# Patient Record
Sex: Male | Born: 1980 | Race: White | Hispanic: No | Marital: Single | Smoking: Current every day smoker
Health system: Southern US, Community
[De-identification: ages and names within clinical notes are randomized; demographics above are authoritative.]

## PROBLEM LIST (undated history)

## (undated) HISTORY — PX: APPENDECTOMY: SHX54

## (undated) HISTORY — PX: TOOTH EXTRACTION: SUR596

---

## 2004-03-21 ENCOUNTER — Emergency Department: Payer: Self-pay | Admitting: Emergency Medicine

## 2004-06-29 ENCOUNTER — Ambulatory Visit: Payer: Self-pay | Admitting: Internal Medicine

## 2008-02-22 ENCOUNTER — Emergency Department: Payer: Self-pay | Admitting: Emergency Medicine

## 2019-01-17 ENCOUNTER — Emergency Department
Admission: EM | Admit: 2019-01-17 | Discharge: 2019-01-18 | Disposition: A | Discharge status: Home / Self Care | Payer: Self-pay | Attending: Emergency Medicine | Admitting: Emergency Medicine

## 2019-01-17 ENCOUNTER — Encounter: Payer: Self-pay | Admitting: Emergency Medicine

## 2019-01-17 ENCOUNTER — Emergency Department: Payer: Self-pay

## 2019-01-17 ENCOUNTER — Other Ambulatory Visit: Payer: Self-pay

## 2019-01-17 DIAGNOSIS — R55 Syncope and collapse: Secondary | ICD-10-CM | POA: Insufficient documentation

## 2019-01-17 DIAGNOSIS — R0789 Other chest pain: Secondary | ICD-10-CM | POA: Insufficient documentation

## 2019-01-17 DIAGNOSIS — E860 Dehydration: Secondary | ICD-10-CM | POA: Insufficient documentation

## 2019-01-17 LAB — BASIC METABOLIC PANEL
Anion gap: 13 (ref 5–15)
BUN: <5 mg/dL — ABNORMAL LOW (ref 6–20)
CO2: 20 mmol/L — ABNORMAL LOW (ref 22–32)
Calcium: 9.4 mg/dL (ref 8.9–10.3)
Chloride: 101 mmol/L (ref 98–111)
Creatinine, Ser: 0.8 mg/dL (ref 0.61–1.24)
GFR calc Af Amer: >60 mL/min (ref >60)
GFR calc non Af Amer: >60 mL/min (ref >60)
Glucose, Bld: 105 mg/dL — ABNORMAL HIGH (ref 70–99)
Potassium: 3.6 mmol/L (ref 3.5–5.1)
Sodium: 134 mmol/L — ABNORMAL LOW (ref 135–145)

## 2019-01-17 LAB — TROPONIN I (HIGH SENSITIVITY): Troponin I (High Sensitivity): 3 ng/L (ref <18)

## 2019-01-17 LAB — CBC
HCT: 46.1 % (ref 39.0–52.0)
Hemoglobin: 16.2 g/dL (ref 13.0–17.0)
MCH: 33.1 pg (ref 26.0–34.0)
MCHC: 35.1 g/dL (ref 30.0–36.0)
MCV: 94.3 fL (ref 80.0–100.0)
Platelets: 243 10*3/uL (ref 150–400)
RBC: 4.89 MIL/uL (ref 4.22–5.81)
RDW: 12.8 % (ref 11.5–15.5)
WBC: 10.2 10*3/uL (ref 4.0–10.5)
nRBC: 0 % (ref 0.0–0.2)

## 2019-01-17 MED ORDER — SODIUM CHLORIDE 0.9 % IV BOLUS
1000.0000 mL | Freq: Once | INTRAVENOUS | Status: AC
  Administered 2019-01-18: 1000 mL via INTRAVENOUS

## 2019-01-17 MED ORDER — SODIUM CHLORIDE 0.9% FLUSH: 3.0000 mL | Freq: Once | INTRAVENOUS | Status: DC

## 2019-01-17 NOTE — ED Triage Notes (Signed)
C/O dizziness x 1 hour.  States arms and legs are shaking, bilateral arm numbness.  Patient is anxious.  No SOB/ DOE.  NAD

## 2019-01-17 NOTE — ED Notes (Signed)
Dr Alfred Levins in to see pt

## 2019-01-17 NOTE — ED Notes (Signed)
See triage note. Pt c/o tremor to BUE with numbness to both arms that has improved since arrival. ROM intact, grip strength equal. No facial droop, no slurred speech noted at this time. Pt ambulatory with steady gait. States this has never happened to him before. Pt does endorse alcohol use about x3 days/wk, drinking 6-12 beers at a time. Denies any illicit drug use.

## 2019-01-17 NOTE — ED Provider Notes (Addendum)
[facility] Emergency Department Provider Note  ____________________________________________  Time seen: Approximately 11:40 PM  I have reviewed the triage vital signs and the nursing notes.   HISTORY  Chief Complaint No chief complaint on file.   HPI Robert Henderson is a 38 y.o. male with a history of smoking and alcohol abuse who presents for evaluation of chest pain.  Patient reports being in his usual state of health this afternoon when he drove over to his boss to feed his animals.  On his way back home he started feeling lightheaded like he was going to pass out.  He felt nauseated and started having left-sided chest pain.  This started around 2:30 PM.  The pain has been constant.  He describes the pain as stabbing, located in the left side of his chest, constant and nonradiating.  He reports associated numbness of bilateral upper extremities and also mild tremor.  Patient reports having 12 beers yesterday evening.  He also works a lot around [employer].  He works with a Runner, broadcasting/film/video.  He denies any known trauma.  He denies any shortness of breath, fever, cough, personal or family history of heart problems, PE or DVT, recent travel immobilization, leg pain or swelling, hemoptysis, or exogenous hormones.  He denies any drug use.   PMH None - reviewed  Past Surgical History:  Procedure Laterality Date  . APPENDECTOMY      Prior to Admission medications   Not on File    Allergies Augmentin [amoxicillin-pot clavulanate]  FH No history of PE, DVT, CAD  Social History Social History   Tobacco Use  . Smoking status: Current Every Day Smoker    Packs/day: 1.00    Types: Cigarettes  . Smokeless tobacco: Never Used  Substance Use Topics  . Alcohol use: Yes    Comment: 6-12 beers x3 days a week  . Drug use: Not on file    Review of Systems  Constitutional: Negative for fever. + Lightheadedness Eyes: Negative for visual changes. ENT:  Negative for sore throat. Neck: No neck pain  Cardiovascular: + chest pain. Respiratory: Negative for shortness of breath. Gastrointestinal: Negative for abdominal pain, vomiting or diarrhea. + Nausea Genitourinary: Negative for dysuria. Musculoskeletal: Negative for back pain. Skin: Negative for rash. Neurological: Negative for headaches, weakness. + b/l upper extremity numbness and tremor Psych: No SI or HI  ____________________________________________   PHYSICAL EXAM:  VITAL SIGNS: ED Triage Vitals  Enc Vitals Group     BP 01/17/19 1644 (!) 151/89     Pulse Rate 01/17/19 1644 91     Resp 01/17/19 1644 20     Temp 01/17/19 1644 98.9 F (37.2 C)     Temp Source 01/17/19 1644 Oral     SpO2 01/17/19 1644 100 %     Weight 01/17/19 1634 280 lb (127 kg)     Height 01/17/19 1645 6' (1.829 m)     Head Circumference --      Peak Flow --      Pain Score 01/17/19 1634 0     Pain Loc --      Pain Edu? --      Excl. in [place]? --     Constitutional: Alert and oriented. Well appearing and in no apparent distress. HEENT:      Head: Normocephalic and atraumatic.         Eyes: Conjunctivae are normal. Sclera is non-icteric.       Mouth/Throat: Mucous membranes are  moist.       Neck: Supple with no signs of meningismus. Cardiovascular: Regular rate and rhythm. No murmurs, gallops, or rubs. 2+ symmetrical distal pulses are present in all extremities. No JVD.  Palpation of the left side of the chest reproduces the pain. Respiratory: Normal respiratory effort. Lungs are clear to auscultation bilaterally. No wheezes, crackles, or rhonchi.  Gastrointestinal: Soft, non tender, and non distended with positive bowel sounds. No rebound or guarding. Musculoskeletal: Nontender with normal range of motion in all extremities. No edema, cyanosis, or erythema of extremities. Neurologic: Normal speech and language. Face is symmetric.  Intact strength and sensation x4, patient does have a slight tremor.  Skin: Skin is warm, dry and intact. No rash noted. Psychiatric: Mood and affect are normal. Speech and behavior are normal.  ____________________________________________   LABS (all labs ordered are listed, but only abnormal results are displayed)  Labs Reviewed  BASIC METABOLIC PANEL - Abnormal; Notable for the following components:      Result Value   Sodium 134 (*)    CO2 20 (*)    Glucose, Bld 105 (*)    BUN <5 (*)    All other components within normal limits  CBC  URINE DRUG SCREEN, QUALITATIVE ([facility] ONLY)  CK  ETHANOL  TROPONIN I (HIGH SENSITIVITY)  TROPONIN I (HIGH SENSITIVITY)   ____________________________________________  EKG  ED ECG REPORT I, Rudene Re, the attending physician, personally viewed and interpreted this ECG.  Sinus bradycardia, rate of 57, normal intervals, normal axis, no ST elevations or depressions.  Normal EKG otherwise. ____________________________________________  RADIOLOGY  I have personally reviewed the images performed during this visit and I agree with the Radiologist's read.   Interpretation by Radiologist:  Dg Chest 2 View  Result Date: 01/17/2019 CLINICAL DATA:  Chest tightness.  Numbness in both arms. EXAM: CHEST - 2 VIEW COMPARISON:  None. FINDINGS: The heart size and mediastinal contours are within normal limits. Both lungs are clear. The visualized skeletal structures are unremarkable. IMPRESSION: No active cardiopulmonary disease. Electronically Signed   By: Dorise Bullion III M.D   On: 01/17/2019 17:02   Ct Angio Chest Aorta W And/or Wo Contrast  Result Date: 01/18/2019 CLINICAL DATA:  Chest and back pain, aortic dissection suspected EXAM: CT ANGIOGRAPHY CHEST WITH CONTRAST TECHNIQUE: Multidetector CT imaging of the chest was performed using the standard protocol during bolus administration of intravenous contrast. Multiplanar CT image reconstructions and MIPs were obtained to evaluate the vascular anatomy. CONTRAST:   142mL OMNIPAQUE IOHEXOL 350 MG/ML SOLN COMPARISON:  Chest radiograph January 17, 2019 FINDINGS: Cardiovascular: Noncontrast evaluation of the aorta demonstrates no abnormal hyperattenuating mural thickening or other evidence of intramural hematoma. Postcontrast acquisition demonstrates preferential opacification of the thoracic aorta. The aortic root is suboptimally assessed given cardiac pulsation artifact. The aorta is normal caliber. No intramural hematoma, dissection flap or other luminal abnormality of the aorta is seen. No periaortic stranding or hemorrhage. Normal heart size. No pericardial effusion. Trace amount of pericardial fluid is within physiologic normal. Limited evaluation of the central pulmonary arteries demonstrates no large filling defect. 9 in Mediastinum/Nodes: No enlarged mediastinal, hilar, or axillary lymph nodes. Thyroid gland, trachea, and esophagus demonstrate no significant findings. Lungs/Pleura: Bandlike scarring and/or atelectasis in the left lung base. No consolidation, features of edema, pneumothorax, or effusion. No suspicious pulmonary nodules or masses. Upper Abdomen: No acute abnormalities present in the visualized portions of the upper abdomen. Musculoskeletal: No acute osseous abnormality or suspicious osseous lesion. Review of  the MIP images confirms the above findings. IMPRESSION: No evidence of acute aortic syndrome. No acute intrathoracic process. Electronically Signed   By: Lovena Le M.D.   On: 01/18/2019 00:52     ____________________________________________   PROCEDURES  Procedure(s) performed: None Procedures Critical Care performed:  None ____________________________________________   INITIAL IMPRESSION / ASSESSMENT AND PLAN / ED COURSE   38 y.o. male with a history of smoking and alcohol abuse who presents for evaluation of lightheadedness, nausea, chest pain, b/l UE numbness and tremor. + large amount of EtOH last night.  Ddx anxiety, alcohol  withdrawal, dehydration, electrolyte abnormalities, gastritis, acs, dissection  Patient is well-appearing with a mild tremor, normal vital signs, palpation of the left chest wall reproduces the pain, lungs are clear to auscultation, pulses are equal in all 4 extremities.  He is neurologically intact otherwise.  Initial EKG was abnormal however that was due to erroneous lead placement.  Therefore repeat EKG was done which showed sinus bradycardia with no dysrhythmia or ischemic changes.  Chest x-ray with no evidence of pneumothorax, pneumonia, pulmonary edema.  Troponin x 2 negative.  Labs showing no electrolyte abnormalities, no significant dehydration.  Since patient is complaining of numbness in both upper extremities and chest pain he was sent for CT Angio of the chest to rule out dissection and that was negative for dissection, PE, or any other acute findings. CK negative for rhabdo. UDS negative. Patient given IVF and tylenol for pain. Pain most likely MSK as it is reproducible on palpation. Near syncope most likely due to dehydration in the setting of large amount of EtOH and working under heat conditions. Tremor, dizziness, and numbness resolved with IVF. Recommended heat, and tylenol/ ibuprofen for MSK CP and f/u with PCP. Discussed my standard return precautions. Patient monitored on telemetry for several hours with no evidence of dysrhythmias.       As part of my medical decision making, I reviewed the following data within the [place] notes reviewed and incorporated, Labs reviewed , EKG interpreted , Old chart reviewed, Radiograph reviewed , Notes from prior ED visits and [place] Controlled Substance Database   Patient was evaluated in Emergency Department today for the symptoms described in the history of present illness. Patient was evaluated in the context of the global COVID-19 pandemic, which necessitated consideration that the patient might be at risk for infection  with the SARS-CoV-2 virus that causes COVID-19. Institutional protocols and algorithms that pertain to the evaluation of patients at risk for COVID-19 are in a state of rapid change based on information released by regulatory bodies including the CDC and federal and state organizations. These policies and algorithms were followed during the patient's care in the ED.   ____________________________________________   FINAL CLINICAL IMPRESSION(S) / ED DIAGNOSES   Final diagnoses:  Near syncope  Atypical chest pain  Dehydration      NEW MEDICATIONS STARTED DURING THIS VISIT:  ED Discharge Orders    None       Note:  This document was prepared using Dragon voice recognition software and may include unintentional dictation errors.    Alfred Levins, [place], MD 01/18/19 [place], [place], MD 01/18/19 [id number]    Rudene Re, MD 01/18/19 [phone]

## 2019-01-18 ENCOUNTER — Emergency Department: Payer: Self-pay

## 2019-01-18 LAB — URINE DRUG SCREEN, QUALITATIVE (ARMC ONLY)
Amphetamines, Ur Screen: NOT DETECTED
Barbiturates, Ur Screen: NOT DETECTED
Benzodiazepine, Ur Scrn: NOT DETECTED
Cannabinoid 50 Ng, Ur ~~LOC~~: NOT DETECTED
Cocaine Metabolite,Ur ~~LOC~~: NOT DETECTED
MDMA (Ecstasy)Ur Screen: NOT DETECTED
Methadone Scn, Ur: NOT DETECTED
Opiate, Ur Screen: NOT DETECTED
Phencyclidine (PCP) Ur S: NOT DETECTED
Tricyclic, Ur Screen: NOT DETECTED

## 2019-01-18 LAB — TROPONIN I (HIGH SENSITIVITY): Troponin I (High Sensitivity): 4 ng/L (ref <18)

## 2019-01-18 LAB — CK: Total CK: 102 U/L (ref 49–397)

## 2019-01-18 MED ORDER — ACETAMINOPHEN 500 MG PO TABS
1000.0000 mg | ORAL_TABLET | Freq: Once | ORAL | Status: AC
  Administered 2019-01-18: 1000 mg via ORAL
  Filled 2019-01-18: qty 2

## 2019-01-18 MED ORDER — IOHEXOL 350 MG/ML SOLN
100.0000 mL | Freq: Once | INTRAVENOUS | Status: AC | PRN
  Administered 2019-01-18: 100 mL via INTRAVENOUS

## 2019-01-18 NOTE — ED Notes (Signed)
Peripheral IV discontinued. Catheter intact. No signs of infiltration or redness. Gauze applied to IV site.  Discharge instructions reviewed with patient. Questions fielded by this RN. Patient verbalizes understanding of instructions. Patient discharged home in stable condition per veronese. No acute distress noted at time of discharge.   

## 2019-01-18 NOTE — ED Notes (Signed)
IV fluids infusing without difficulty; awaiting CT, pt aware; lights out to rest; side rail up x 1 with call bell in reach;

## 2019-01-18 NOTE — ED Notes (Signed)
Pt to CT via stretcher

## 2019-01-18 NOTE — Discharge Instructions (Addendum)

## 2020-10-23 IMAGING — CR CHEST - 2 VIEW
1 series · 2 of 2 positions shown · non-contrast
Comparison: None.

CLINICAL DATA: Chest tightness.  Numbness in both arms.

EXAM:
CHEST - 2 VIEW

[Series 1: dg chest 2 view · 0.14mm/px · 2 of 2 slices shown]
[im 1/2]
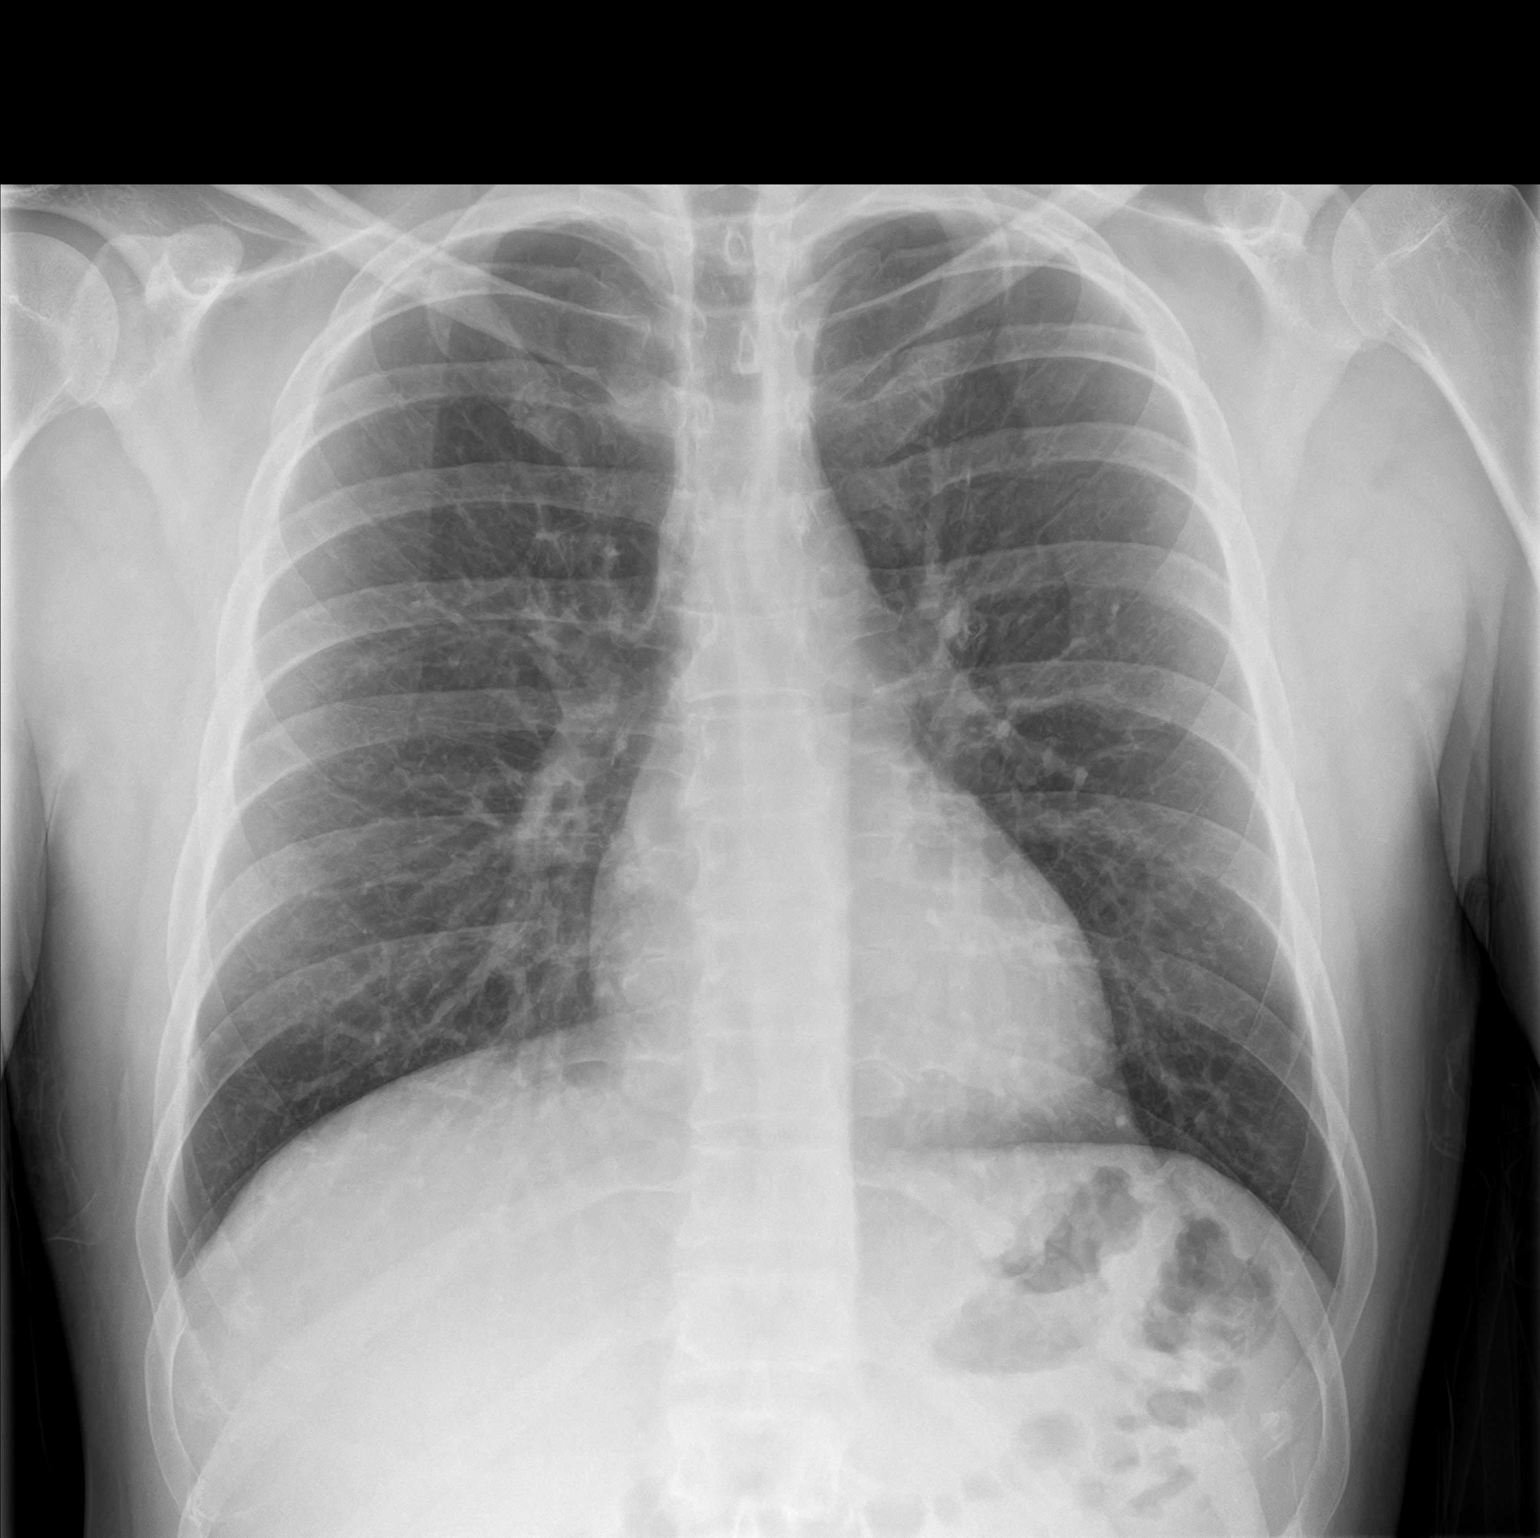
[im 2/2]
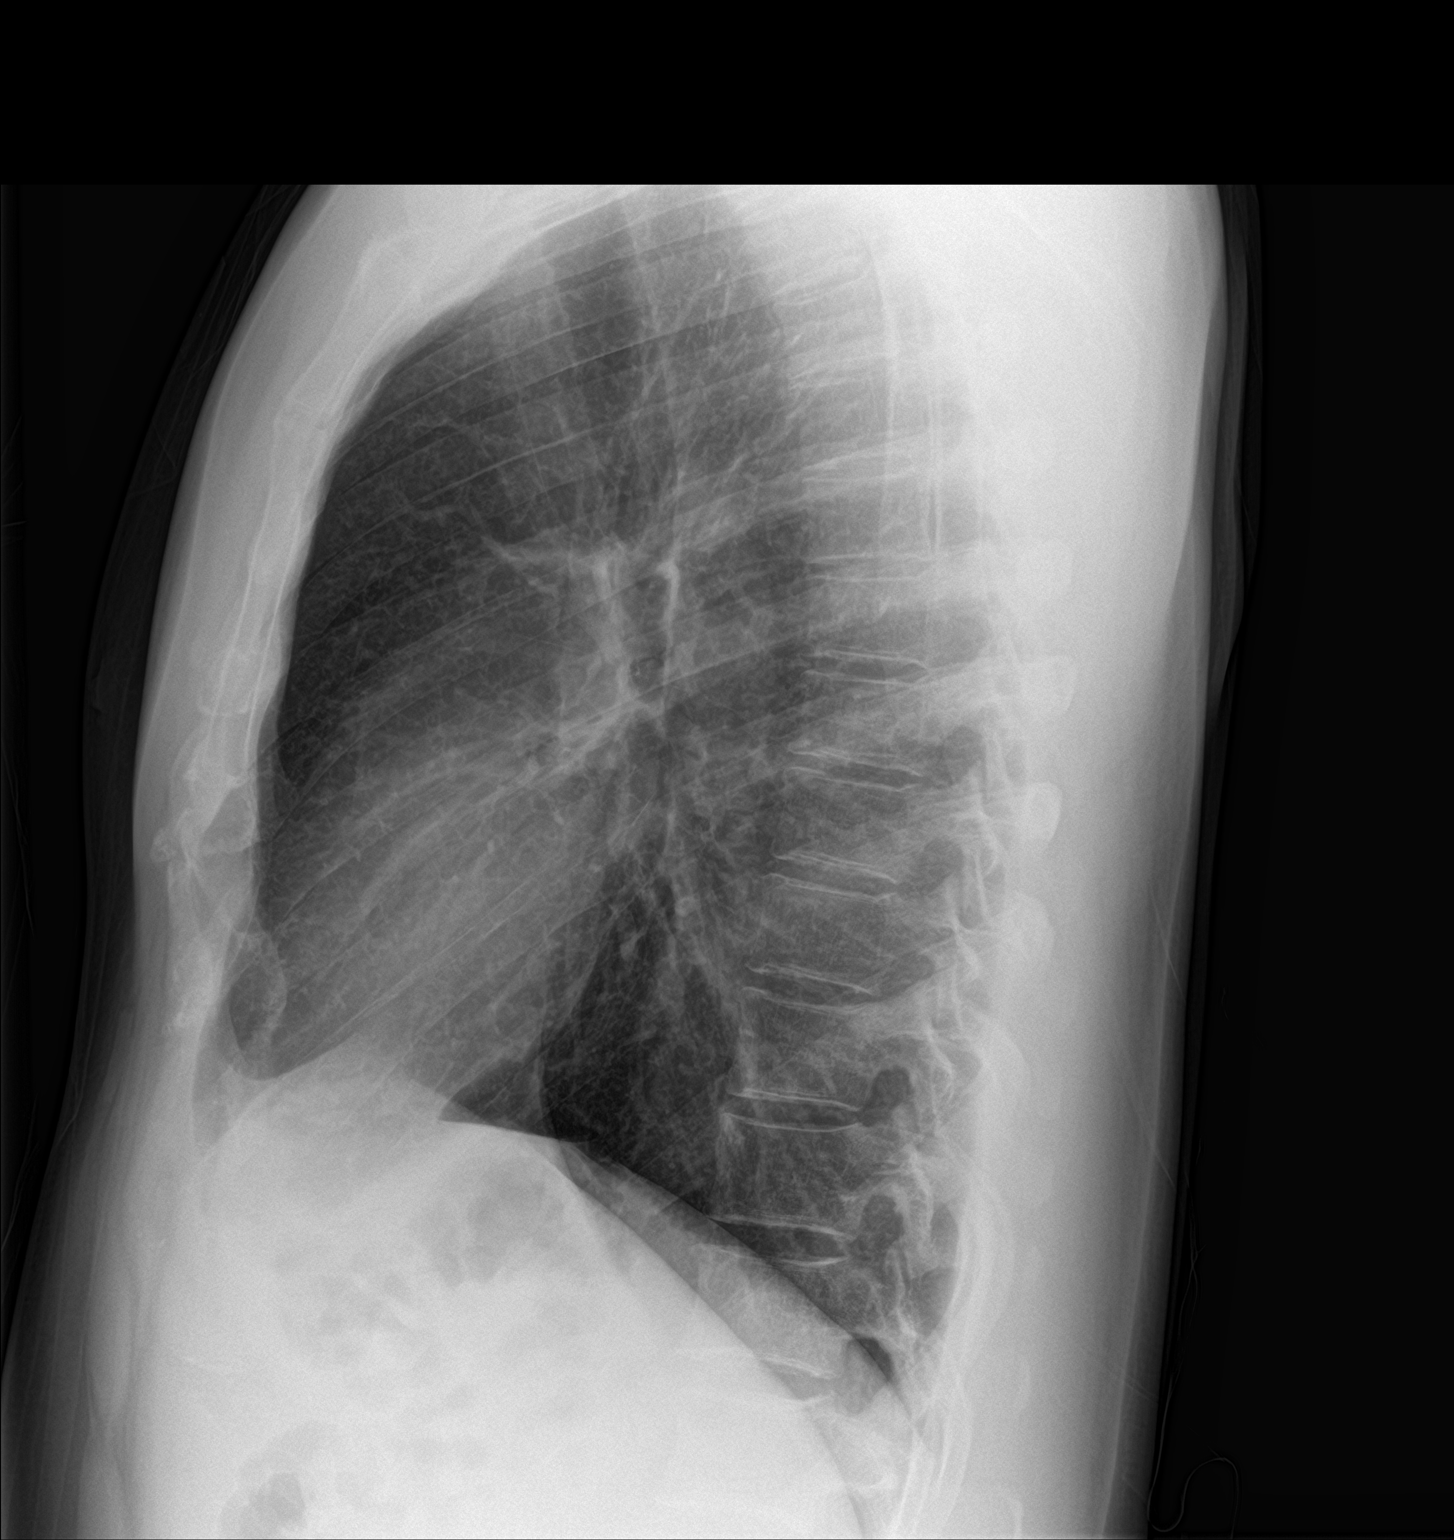

[2 of 2 positions shown; findings below may reference images not displayed]

FINDINGS: The heart size and mediastinal contours are within normal limits.
Both lungs are clear. The visualized skeletal structures are
unremarkable.
IMPRESSION: No active cardiopulmonary disease.

## 2021-07-11 ENCOUNTER — Telehealth: Payer: Self-pay

## 2021-07-11 NOTE — Telephone Encounter (Signed)
CALLED PATIENT NO ANSWER LEFT VOICEMAIL FOR A CALL BACK ? ?

## 2021-07-12 ENCOUNTER — Telehealth: Payer: Self-pay

## 2021-07-12 NOTE — Telephone Encounter (Signed)
Scheduled got 09/03/2021

## 2021-09-03 ENCOUNTER — Encounter: Payer: Self-pay | Admitting: Surgery

## 2021-09-03 ENCOUNTER — Ambulatory Visit (INDEPENDENT_AMBULATORY_CARE_PROVIDER_SITE_OTHER): Payer: Self-pay | Admitting: Gastroenterology

## 2021-09-03 ENCOUNTER — Encounter: Payer: Self-pay | Admitting: Gastroenterology

## 2021-09-03 ENCOUNTER — Ambulatory Visit (INDEPENDENT_AMBULATORY_CARE_PROVIDER_SITE_OTHER): Payer: Self-pay | Admitting: Surgery

## 2021-09-03 VITALS — BP 125/86 | HR 73 | Temp 98.3°F | Ht 72.0 in | Wt 213.0 lb

## 2021-09-03 VITALS — BP 116/79 | HR 81 | Temp 98.4°F | Ht 72.0 in | Wt 214.0 lb

## 2021-09-03 DIAGNOSIS — R1011 Right upper quadrant pain: Secondary | ICD-10-CM

## 2021-09-03 DIAGNOSIS — G8929 Other chronic pain: Secondary | ICD-10-CM

## 2021-09-03 NOTE — Progress Notes (Signed)
? ? ?Gastroenterology Consultation ? ?[facility]     [facility] [place]  [place], [name] (Inactive) ?Primary Gastroenterologist:  Robert Henderson     ?Reason for Consultation:     Gastritis ?      ? HPI:   ?Robert Henderson is a 41 y.o. y/o male referred for consultation & management of gastritis by Dr. Patient, [name] (Inactive).  This patient was sent for consultation from the [place] with recent studies showing C. difficile negative with a right upper quadrant ultrasound that showed: ? ?Impression: ?1. No evidence of cholelithiasis or acute cholecystitis.  ?2. Hepatic steatosis.  ? ?In March of this year the patient was seen at the emergency department for right lower quadrant pain and had a CT scan that showed: ? ?Impression ? ?1.No CT evidence to suggest acute appendicitis or diverticulitis as clinically questioned.  ?2.Circumferential wall thickening of the colon most prominent in the descending colon to the rectum. This can be seen with colitis and can be of infectious or inflammatory etiology.  ?3.Intermediate attenuation structure within the left inguinal canal which the spermatic cord does not extend past. This could represent high riding testicle. Correlate clinically.  ?4.Additional chronic and incidental findings as above. ? ?The patient was started on ciprofloxacin and metronidazole and told to follow-up with GI. ?The patient reports that his abdominal pain is on his right side and has become constant.  He thought that it may be related to food but he states that when he started to keep a log he did not realize any foods make it better or worse.  The patient was told to take Tylenol for the pain but has not done much of that and he denies any NSAID use.  He does have occasional dark stools but not on a regular basis and sometimes he states his stools are pale. ? ?No past medical history on file. ? ?Past Surgical History:  ?Procedure Laterality Date  ? APPENDECTOMY     ? ? ?Prior to Admission medications   ?Not on File  ? ? ?No family history on file.  ? ?Social History  ? ?Tobacco Use  ? Smoking status: Every Day  ?  Packs/day: 1.00  ?  Types: Cigarettes  ? Smokeless tobacco: Never  ?Substance Use Topics  ? Alcohol use: Yes  ?  Comment: 6-12 beers x3 days a week  ? ? ?Allergies as of 09/03/2021 - Review Complete 01/18/2019  ?Allergen Reaction Noted  ? Augmentin [amoxicillin-pot clavulanate]  01/17/2019  ? ? ?Review of Systems:    ?All systems reviewed and negative except where noted in HPI. ? ? Physical Exam:  ?There were no vitals taken for this visit. ?No LMP for male patient. ?General:   Alert,  Well-developed, well-nourished, pleasant and cooperative in NAD ?Head:  Normocephalic and atraumatic. ?Eyes:  Sclera clear, no icterus.   Conjunctiva pink. ?Ears:  Normal auditory acuity. ?Neck:  Supple; no masses or thyromegaly. ?Lungs:  Respirations even and unlabored.  Clear throughout to auscultation.   No wheezes, crackles, or rhonchi. No acute distress. ?Heart:  Regular rate and rhythm; no murmurs, clicks, rubs, or gallops. ?Abdomen:  Normal bowel sounds.  No bruits.  Soft, positive tenderness with a positive Murphy sign and non-distended without masses, hepatosplenomegaly or hernias noted.  No guarding or rebound tenderness.  Negative Carnett sign.   ?Rectal:  Deferred.  ?Pulses:  Normal pulses noted. ?Extremities:  No clubbing or edema.  No  cyanosis. ?Neurologic:  Alert and oriented x3;  grossly normal neurologically. ?Skin:  Intact without significant lesions or rashes.  No jaundice. ?Lymph Nodes:  No significant cervical adenopathy. ?Psych:  Alert and cooperative. Normal mood and affect. ? ?Imaging Studies: ?No results found. ? ?Assessment and Plan:  ? ?Robert Henderson is a 41 y.o. y/o male who comes in today with a positive Murphy sign and severe chronic and constant right upper quadrant pain.  The patient has been explained my findings and that with the abnormal CT  scan showing colitis and intermittent dark stools he should consider an EGD and colonoscopy but because he has a significantly positive Murphy sign I would like him to be seen by a surgeon today.  I have called Robert Henderson who has agreed to see him today.  The patient will go right over to Robert Henderson office to be evaluated.  The patient has been explained the plan and agrees with it.  If his gallbladder does not seem to be the problem then he will undergo an EGD and colonoscopy. ? ? ? ?Robert Lame, MD. [facility] Regal ? ? ? Note: This dictation was prepared with Dragon dictation along with smaller phrase technology. Any transcriptional errors that result from this process are unintentional.   ?

## 2021-09-03 NOTE — H&P (View-Only) (Signed)
? ? ?Gastroenterology Consultation ? ?[place]     [facility]  Patient, [name] (Inactive) ?Primary Gastroenterologist:  Dr. Allen Norris     ?Reason for Consultation:     Gastritis ?      ? HPI:   ?Robert Henderson is a 41 y.o. y/o male referred for consultation & management of gastritis by Dr. Patient, [name] (Inactive).  This patient was sent for consultation from the [place] with recent studies showing C. difficile negative with a right upper quadrant ultrasound that showed: ? ?Impression: ?1. No evidence of cholelithiasis or acute cholecystitis.  ?2. Hepatic steatosis.  ? ?In March of this year the patient was seen at the emergency department for right lower quadrant pain and had a CT scan that showed: ? ?Impression ? ?1.No CT evidence to suggest acute appendicitis or diverticulitis as clinically questioned.  ?2.Circumferential wall thickening of the colon most prominent in the descending colon to the rectum. This can be seen with colitis and can be of infectious or inflammatory etiology.  ?3.Intermediate attenuation structure within the left inguinal canal which the spermatic cord does not extend past. This could represent high riding testicle. Correlate clinically.  ?4.Additional chronic and incidental findings as above. ? ?The patient was started on ciprofloxacin and metronidazole and told to follow-up with GI. ?The patient reports that his abdominal pain is on his right side and has become constant.  He thought that it may be related to food but he states that when he started to keep a log he did not realize any foods make it better or worse.  The patient was told to take Tylenol for the pain but has not done much of that and he denies any NSAID use.  He does have occasional dark stools but not on a regular basis and sometimes he states his stools are pale. ? ?No past medical history on file. ? ?Past Surgical History:  ?Procedure Laterality Date  ??  APPENDECTOMY    ? ? ?Prior to Admission medications   ?Not on File  ? ? ?No family history on file.  ? ?Social History  ? ?Tobacco Use  ?? Smoking status: Every Day  ?  Packs/day: 1.00  ?  Types: Cigarettes  ?? Smokeless tobacco: Never  ?Substance Use Topics  ?? Alcohol use: Yes  ?  Comment: 6-12 beers x3 days a week  ? ? ?Allergies as of 09/03/2021 - Review Complete 01/18/2019  ?Allergen Reaction Noted  ?? Augmentin [amoxicillin-pot clavulanate]  01/17/2019  ? ? ?Review of Systems:    ?All systems reviewed and negative except where noted in HPI. ? ? Physical Exam:  ?There were no vitals taken for this visit. ?No LMP for male patient. ?General:   Alert,  Well-developed, well-nourished, pleasant and cooperative in NAD ?Head:  Normocephalic and atraumatic. ?Eyes:  Sclera clear, no icterus.   Conjunctiva pink. ?Ears:  Normal auditory acuity. ?Neck:  Supple; no masses or thyromegaly. ?Lungs:  Respirations even and unlabored.  Clear throughout to auscultation.   No wheezes, crackles, or rhonchi. No acute distress. ?Heart:  Regular rate and rhythm; no murmurs, clicks, rubs, or gallops. ?Abdomen:  Normal bowel sounds.  No bruits.  Soft, positive tenderness with a positive Murphy sign and non-distended without masses, hepatosplenomegaly or hernias noted.  No guarding or rebound tenderness.  Negative Carnett sign.   ?Rectal:  Deferred.  ?Pulses:  Normal pulses noted. ?Extremities:  No clubbing or edema.  No  cyanosis. ?Neurologic:  Alert and oriented x3;  grossly normal neurologically. ?Skin:  Intact without significant lesions or rashes.  No jaundice. ?Lymph Nodes:  No significant cervical adenopathy. ?Psych:  Alert and cooperative. Normal mood and affect. ? ?Imaging Studies: ?No results found. ? ?Assessment and Plan:  ? ?Robert Henderson is a 41 y.o. y/o male who comes in today with a positive Murphy sign and severe chronic and constant right upper quadrant pain.  The patient has been explained my findings and that with  the abnormal CT scan showing colitis and intermittent dark stools he should consider an EGD and colonoscopy but because he has a significantly positive Murphy sign I would like him to be seen by a surgeon today.  I have called Dr. Dahlia Byes who has agreed to see him today.  The patient will go right over to Dr. Harlow Ohms office to be evaluated.  The patient has been explained the plan and agrees with it.  If his gallbladder does not seem to be the problem then he will undergo an EGD and colonoscopy. ? ? ? ?Lucilla Lame, MD. [facility] Regal ? ? ? Note: This dictation was prepared with Dragon dictation along with smaller phrase technology. Any transcriptional errors that result from this process are unintentional.   ?

## 2021-09-03 NOTE — Patient Instructions (Addendum)
We have scheduled you for a HIDA scan to further assess your gallbladder. ? ?This is scheduled at [facility] [place] entrance on Thursday the 20th. You will need to arrive at the [place] at 12:00 pm. You will need to have nothing to eat or drink after 6:30 am. ? ?We will have you follow up here next week.  ?

## 2021-09-04 NOTE — Progress Notes (Signed)
Patient ID: Robert Henderson, male   DOB: December 10, 1980, 41 y.o.   MRN: [id number] ? ?HPI ?Robert Henderson is a 41 y.o. male seen in consultation at the request of Dr.Wohl for right upper quadrant pain positive Murphy sign.  Upon interrogation patient patient endorses abdominal pain for the past couple of years.  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  Apparently he has had prior work-up to include a CT scan as well as ultrasound of the right upper quadrant.  Report personally reviewed showing no evidence of acute cholecystitis some hepatic asteatosis he had some questionable colitis.  And questionable high riding testicle. ?He endorses right upper quadrant pain daily.  Before there was some worsening with certain meals now he experiences no correlation. ?He did have an appendectomy when he was 71 back in [place] while he was serving in the [employer].  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  He endorses smoking daily. 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  I specifically asked him about this and he denied any use. 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  It Is in the interesting to note that at that time was presented with chest pain ?HPI ? ?No past medical history on file. ? ?Past Surgical History:  ?Procedure Laterality Date  ? APPENDECTOMY    ? ? ?Family History  ?Problem Relation Age of Onset  ? Colon polyps Mother   ? Stomach cancer Father   ? ? ?Social History ?Social History  ? ?Tobacco Use  ? Smoking status: Every Day  ?  Packs/day: 1.00  ?  Types: Cigarettes  ? Smokeless tobacco: Never  ?Vaping Use  ? Vaping Use: Never used  ?Substance Use Topics  ? Alcohol use: Not Currently  ?  Comment: 6-12 beers x3 days a week  ? ? ?Allergies  ?Allergen Reactions  ? Augmentin [Amoxicillin-Pot  Clavulanate] Hives  ? ? ?Current Outpatient Medications  ?Medication Sig Dispense Refill  ? acetaminophen (TYLENOL) 325 MG tablet Take 650 mg by mouth every 6 (six) hours as needed.    ? ibuprofen (ADVIL) 200 MG tablet Take 200 mg by mouth every 6 (six) hours as needed.    ? ?No current facility-administered medications for this visit.  ? ? ? ?Review of Systems ?Full ROS  was asked and was negative except for the information on the HPI ? ?Physical Exam ?Blood pressure 125/86, pulse 73, temperature 98.3 ?F (36.8 ?C), height 6' (1.829 m), weight 213 lb (96.6 kg), SpO2 98 %. ?CONSTITUTIONAL: NAD. ?EYES: Pupils are equal, round,, Sclera are non-icteric. ?EARS, NOSE, MOUTH AND THROAT:  The oral mucosa is pink and moist. Poor dentition with tooth decay .Hearing is intact to voice. ?LYMPH NODES:  Lymph nodes in the neck are normal. ?RESPIRATORY:  Lungs are clear. There is normal respiratory effort, with equal breath sounds bilaterally, and without pathologic use of accessory muscles.Some soreness on chest wall on the right side ?CARDIOVASCULAR: Heart is regular without murmurs, gallops, or rubs. ?GI: The abdomen is  soft, tender right upper quadrant and overall diffusely  There are no palpable masses. There is no hepatosplenomegaly. There are normal bowel sounds . ?No rebound, no peritonitis and no definitive Murphy ?GU: Rectal deferred.   ?  MUSCULOSKELETAL: Normal muscle strength and tone. No cyanosis or edema.   ?SKIN: Turgor is good and there are no pathologic skin lesions or ulcers. ?NEUROLOGIC: Motor and sensation is grossly normal. Cranial nerves are grossly intact. ?PSYCH:  Oriented to person, place and time. Affect is normal. ? ?Data Reviewed ? ?I have personally reviewed the patient's imaging, laboratory findings and medical records.   ? ?Assessment/Plan ?41 year old male with chronic abdominal pain.  Certainly differential will include gallbladder disease, musculoskeletal pain such as intercostal neuralgia and  more functional disorder such as colitis or functional GI issues.  At this time there is no need for hospitalization or immediate surgical intervention I will like to interrogate the function of his biliary system with a HIDA scan including ejection fraction.  Discussed with him in detail about my thought process. ?I will circle back with Dr.Wohl as he might need further endoscopic work-up given the prior colitis and puzzling abdominal pain etiology. ?A copy of this report was sent to the referring provider ?Please note that I have spent greater than 45 minutes in this encounter including personally reviewing records, coordinating his care, counseling the patient, placing orders and performing a appropriate documentation ? ? ?Caroleen Hamman, MD FACS ?General Surgeon ?09/04/2021, 2:25 PM ? ?  ?

## 2021-09-06 ENCOUNTER — Encounter
Admission: RE | Admit: 2021-09-06 | Discharge: 2021-09-06 | Disposition: A | Discharge status: Home / Self Care | Payer: No Typology Code available for payment source | Source: Ambulatory Visit | Attending: Surgery | Admitting: Surgery

## 2021-09-06 DIAGNOSIS — R1011 Right upper quadrant pain: Secondary | ICD-10-CM | POA: Diagnosis not present

## 2021-09-06 DIAGNOSIS — G8929 Other chronic pain: Secondary | ICD-10-CM | POA: Insufficient documentation

## 2021-09-06 MED ORDER — TECHNETIUM TC 99M MEBROFENIN IV KIT
5.0000 | PACK | Freq: Once | INTRAVENOUS | Status: AC | PRN
  Administered 2021-09-06: 5.48 via INTRAVENOUS

## 2021-09-07 ENCOUNTER — Telehealth: Payer: Self-pay

## 2021-09-07 NOTE — Telephone Encounter (Signed)
Patient notified normal HIDA scan-keep scheduled appointment 09/10/21. ?

## 2021-09-10 ENCOUNTER — Other Ambulatory Visit: Payer: Self-pay

## 2021-09-10 ENCOUNTER — Ambulatory Visit (INDEPENDENT_AMBULATORY_CARE_PROVIDER_SITE_OTHER): Payer: Self-pay | Admitting: Surgery

## 2021-09-10 ENCOUNTER — Encounter: Payer: Self-pay | Admitting: Surgery

## 2021-09-10 VITALS — BP 114/77 | HR 78 | Temp 98.1°F | Ht 72.0 in | Wt 215.0 lb

## 2021-09-10 DIAGNOSIS — Z719 Counseling, unspecified: Secondary | ICD-10-CM | POA: Insufficient documentation

## 2021-09-10 DIAGNOSIS — F1010 Alcohol abuse, uncomplicated: Secondary | ICD-10-CM | POA: Insufficient documentation

## 2021-09-10 DIAGNOSIS — L247 Irritant contact dermatitis due to plants, except food: Secondary | ICD-10-CM | POA: Insufficient documentation

## 2021-09-10 DIAGNOSIS — F410 Panic disorder [episodic paroxysmal anxiety] without agoraphobia: Secondary | ICD-10-CM | POA: Insufficient documentation

## 2021-09-10 DIAGNOSIS — R1011 Right upper quadrant pain: Secondary | ICD-10-CM

## 2021-09-10 DIAGNOSIS — R1084 Generalized abdominal pain: Secondary | ICD-10-CM | POA: Insufficient documentation

## 2021-09-10 DIAGNOSIS — K047 Periapical abscess without sinus: Secondary | ICD-10-CM | POA: Insufficient documentation

## 2021-09-10 DIAGNOSIS — M79602 Pain in left arm: Secondary | ICD-10-CM | POA: Insufficient documentation

## 2021-09-10 DIAGNOSIS — F17200 Nicotine dependence, unspecified, uncomplicated: Secondary | ICD-10-CM | POA: Insufficient documentation

## 2021-09-10 DIAGNOSIS — Z720 Tobacco use: Secondary | ICD-10-CM | POA: Insufficient documentation

## 2021-09-10 DIAGNOSIS — G8929 Other chronic pain: Secondary | ICD-10-CM

## 2021-09-10 DIAGNOSIS — R0789 Other chest pain: Secondary | ICD-10-CM

## 2021-09-10 NOTE — Progress Notes (Signed)
Outpatient Surgical Follow Up ? ?09/11/2021 ? ?Robert Henderson is an 41 y.o. male.  ? ?Chief Complaint  ?Patient presents with  ? Follow-up  ?  Abdominal pain  ? ? ?HPI: Robert Henderson is following up for right-sided chest wall pain and right upper quadrant abdominal pain.  He reports that he still has it on a daily basis.  Seems to be worse when he stands up.  Also in the [place] region.  He feels that he has about 3 knife that are stuck both on his abdominal and his chest wall. ?Recently completed a HIDA scan that have personally reviewed showing no evidence of cholecystitis and preserve EF.  No evidence of stones or cholecystitis on ultrasound. ?Pain is severe and sharp in intensity. ? ?History reviewed. No pertinent past medical history. ? ?Past Surgical History:  ?Procedure Laterality Date  ? APPENDECTOMY    ? ? ?Family History  ?Problem Relation Age of Onset  ? Colon polyps Mother   ? Stomach cancer Father   ? ? ?Social History:  reports that he has been smoking cigarettes. He has been smoking an average of 1 pack per day. He has never used smokeless tobacco. He reports that he does not currently use alcohol. No history on file for drug use. ? ?Allergies:  ?Allergies  ?Allergen Reactions  ? Amoxicillin-Pot Clavulanate Hives  ? ? ?Medications reviewed. ? ? ?ROS ?Full ROS performed and is otherwise negative other than what is stated in HPI ? ? ?BP 114/77   Pulse 78   Temp 98.1 ?F (36.7 ?C) (Oral)   Ht 6' (1.829 m)   Wt 215 lb (97.5 kg)   SpO2 98%   BMI 29.16 kg/m?  ? ?Physical Exam ?CONSTITUTIONAL: NAD. ?EYES: Pupils are equal, round,, Sclera are non-icteric. ?EARS, NOSE, MOUTH AND THROAT:  The oral mucosa is pink and moist. Poor dentition with tooth decay .Hearing is intact to voice. ?LYMPH NODES:  Lymph nodes in the neck are normal. ?RESPIRATORY:  Lungs are clear. There is normal respiratory effort, with equal breath sounds bilaterally, and without pathologic use of accessory muscles.Tenderness to palpation on  right chest wall and xiphoid ?CARDIOVASCULAR: Heart is regular without murmurs, gallops, or rubs. ?GI: The abdomen is  soft, tender right upper quadrant and overall diffusely  There are no palpable masses. There is no hepatosplenomegaly. There are normal bowel sounds . ?No rebound, no peritonitis and no definitive Murphy ?GU: Rectal deferred.   ?MUSCULOSKELETAL: Normal muscle strength and tone. No cyanosis or edema.   ?SKIN: Turgor is good and there are no pathologic skin lesions or ulcers. ?NEUROLOGIC: Motor and sensation is grossly normal. Cranial nerves are grossly intact. ?PSYCH:  Oriented to person, place and time. Affect is normal. ? ?Assessment/Plan: ?41 year old male with chronic abdominal pain and Right sided chest wall pain.  Certainly differential will include gallbladder disease, musculoskeletal pain such as intercostal neuralgia and more functional disorder such as colitis or functional GI issues.   ?No evidence of cholecystitis or dyskinesia on HIDA. Seems more musculoskeletal in nature. I discussed at length about options to include Intercostal nerve block, for therapeutic and diagnostic purposes. He is desperate and wishes to proceed w IC nerve block. ? ? Discussed with him in detail about my thought process. ?I spent least  40 minutes in this encounter including personally reviewing imaging studies, placing orders, counseling the patient and performing appropriate documentation ?  ? ?Procedure  ?Intercostal nerve block 5-7th IC spaces on the right  ? ?Diagnosis:  Chest wall pain right ? ?Anesthesia: lidocaine 1% w epi and marcaine .25%. 1:1 ratio total 20cc. 40 mg kenalog ? ?Findings: some improvement of chest wall pain but was not drastic ? ?After informed consent was obtained the patient was placed in the supine position and was prepped and draped in usual sterile fashion.  We identified the fifth and 6 and seventh ribs.  We injected the rib and perform an intercostal nerve block by injecting the  periosteum of each rib.  Patient tolerated the procedure well and there were no complications. ? ? ?Caroleen Hamman, MD FACS ?General Surgeon  ?

## 2021-09-10 NOTE — Patient Instructions (Addendum)
Referral to Gastroenterology. Someone from their office will call to schedule an appointment. If you do not hear from their office by Wednesday please call [phone] to expedite an appointment. ? ?Please see your appointment listed below. ? ?

## 2021-09-11 ENCOUNTER — Telehealth: Payer: Self-pay

## 2021-09-11 MED ORDER — NA SULFATE-K SULFATE-MG SULF 17.5-3.13-1.6 GM/177ML PO SOLN: 1.0000 | Freq: Once | ORAL | 0 refills | Status: AC

## 2021-09-11 NOTE — Telephone Encounter (Signed)
Called pt, no answer, lmovm ?

## 2021-09-12 NOTE — Telephone Encounter (Signed)
Pt came into the [place] office to pick up paperwork and I went over instructions with patient and he stated he would be picking up prep today ?

## 2021-09-24 ENCOUNTER — Other Ambulatory Visit: Payer: Self-pay

## 2021-09-24 ENCOUNTER — Ambulatory Visit: Payer: No Typology Code available for payment source | Admitting: Anesthesiology

## 2021-09-24 ENCOUNTER — Encounter
Admission: RE | Disposition: A | Discharge status: Home / Self Care | Payer: Self-pay | Source: Home / Self Care | Attending: Gastroenterology

## 2021-09-24 ENCOUNTER — Encounter: Payer: Self-pay | Admitting: Gastroenterology

## 2021-09-24 ENCOUNTER — Ambulatory Visit
Admission: RE | Admit: 2021-09-24 | Discharge: 2021-09-24 | Disposition: A | Discharge status: Home / Self Care | Payer: No Typology Code available for payment source | Attending: Gastroenterology | Admitting: Gastroenterology

## 2021-09-24 ENCOUNTER — Telehealth: Payer: Self-pay

## 2021-09-24 DIAGNOSIS — R1011 Right upper quadrant pain: Secondary | ICD-10-CM

## 2021-09-24 DIAGNOSIS — D214 Benign neoplasm of connective and other soft tissue of abdomen: Secondary | ICD-10-CM | POA: Diagnosis not present

## 2021-09-24 DIAGNOSIS — R1084 Generalized abdominal pain: Secondary | ICD-10-CM | POA: Insufficient documentation

## 2021-09-24 DIAGNOSIS — K529 Noninfective gastroenteritis and colitis, unspecified: Secondary | ICD-10-CM | POA: Insufficient documentation

## 2021-09-24 DIAGNOSIS — G8929 Other chronic pain: Secondary | ICD-10-CM

## 2021-09-24 DIAGNOSIS — K635 Polyp of colon: Secondary | ICD-10-CM

## 2021-09-24 DIAGNOSIS — F17210 Nicotine dependence, cigarettes, uncomplicated: Secondary | ICD-10-CM | POA: Diagnosis not present

## 2021-09-24 DIAGNOSIS — K319 Disease of stomach and duodenum, unspecified: Secondary | ICD-10-CM | POA: Diagnosis not present

## 2021-09-24 HISTORY — PX: COLONOSCOPY WITH PROPOFOL: SHX5780

## 2021-09-24 HISTORY — PX: ESOPHAGOGASTRODUODENOSCOPY (EGD) WITH PROPOFOL: SHX5813

## 2021-09-24 SURGERY — COLONOSCOPY WITH PROPOFOL
Anesthesia: General

## 2021-09-24 MED ORDER — PROPOFOL 10 MG/ML IV BOLUS
INTRAVENOUS | Status: DC | PRN
  Administered 2021-09-24: 30 mg via INTRAVENOUS
  Administered 2021-09-24: 50 mg via INTRAVENOUS
  Administered 2021-09-24 (×2): 30 mg via INTRAVENOUS
  Administered 2021-09-24 (×2): 40 mg via INTRAVENOUS
  Administered 2021-09-24: 20 mg via INTRAVENOUS
  Administered 2021-09-24: 30 mg via INTRAVENOUS
  Administered 2021-09-24: 50 mg via INTRAVENOUS

## 2021-09-24 MED ORDER — LACTATED RINGERS IV SOLN: INTRAVENOUS | Status: DC

## 2021-09-24 MED ORDER — GLYCOPYRROLATE 0.2 MG/ML IJ SOLN
INTRAMUSCULAR | Status: DC | PRN
  Administered 2021-09-24: .1 mg via INTRAVENOUS

## 2021-09-24 MED ORDER — STERILE WATER FOR IRRIGATION IR SOLN
Status: DC | PRN
  Administered 2021-09-24: .05 mL

## 2021-09-24 MED ORDER — LIDOCAINE HCL (CARDIAC) PF 100 MG/5ML IV SOSY
PREFILLED_SYRINGE | INTRAVENOUS | Status: DC | PRN
  Administered 2021-09-24: 30 mg via INTRAVENOUS

## 2021-09-24 MED ORDER — STERILE WATER FOR IRRIGATION IR SOLN
Status: DC | PRN
  Administered 2021-09-24: 500 mL

## 2021-09-24 MED ORDER — SODIUM CHLORIDE 0.9 % IV SOLN: INTRAVENOUS | Status: DC

## 2021-09-24 SURGICAL SUPPLY — 10 items
BLOCK BITE 60FR ADLT L/F GRN (MISCELLANEOUS) ×2 IMPLANT
FORCEPS BIOP RAD 4 LRG CAP 4 (CUTTING FORCEPS) ×1 IMPLANT
GOWN CVR UNV OPN BCK APRN NK (MISCELLANEOUS) ×2 IMPLANT
GOWN ISOL THUMB LOOP REG UNIV (MISCELLANEOUS) ×4
KIT PRC NS LF DISP ENDO (KITS) ×1 IMPLANT
KIT PROCEDURE OLYMPUS (KITS) ×2
MANIFOLD NEPTUNE II (INSTRUMENTS) ×2 IMPLANT
SNARE COLD EXACTO (MISCELLANEOUS) ×1 IMPLANT
TRAP ETRAP POLY (MISCELLANEOUS) ×1 IMPLANT
WATER STERILE IRR 250ML POUR (IV SOLUTION) ×2 IMPLANT

## 2021-09-24 NOTE — Anesthesia Postprocedure Evaluation (Signed)
Anesthesia Post Note ? ?Patient: Robert Henderson ? ?Procedure(s) Performed: COLONOSCOPY WITH PROPOFOL ?ESOPHAGOGASTRODUODENOSCOPY (EGD) WITH PROPOFOL ? ? ?  ?Patient location during evaluation: PACU ?Anesthesia Type: General ?Level of consciousness: awake and alert ?Pain management: pain level controlled ?Vital Signs Assessment: post-procedure vital signs reviewed and stable ?Respiratory status: spontaneous breathing, nonlabored ventilation, respiratory function stable and patient connected to nasal cannula oxygen ?Cardiovascular status: blood pressure returned to baseline and stable ?Postop Assessment: no apparent nausea or vomiting ?Anesthetic complications: no ? ? ?No notable events documented. ? ?Trecia Rogers ? ? ? ? ? ?

## 2021-09-24 NOTE — Anesthesia Procedure Notes (Signed)
Date/Time: 09/24/2021 11:07 AM ?Performed by: Cameron Ali, CRNA ?Pre-anesthesia Checklist: Patient identified, Emergency Drugs available, Suction available, Timeout performed and Patient being monitored ?Patient Re-evaluated:Patient Re-evaluated prior to induction ?Oxygen Delivery Method: Nasal cannula ?Placement Confirmation: positive ETCO2 ? ? ? ? ?

## 2021-09-24 NOTE — Telephone Encounter (Signed)
Faxed medical records to [facility] with [place] at [phone]. ?

## 2021-09-24 NOTE — Anesthesia Preprocedure Evaluation (Signed)
Anesthesia Evaluation  ?Patient identified by MRN, date of birth, ID band ?Patient awake ? ? ? ?Reviewed: ?Allergy & Precautions, H&P , NPO status , Patient's Chart, lab work & pertinent test results, reviewed documented beta blocker date and time  ? ?Airway ?Mallampati: II ? ?TM Distance: >3 FB ?Neck ROM: full ? ? ? Dental ? ?(+) Missing, Poor Dentition ?  ?Pulmonary ?neg pulmonary ROS, Current SmokerPatient did not abstain from smoking.,  ?  ?Pulmonary exam normal ?breath sounds clear to auscultation ? ? ? ? ? ? Cardiovascular ?Exercise Tolerance: Good ?negative cardio ROS ?Normal cardiovascular exam ?Rhythm:regular Rate:Normal ? ? ?  ?Neuro/Psych ?negative neurological ROS ? negative psych ROS  ? GI/Hepatic ?negative GI ROS, Neg liver ROS,   ?Endo/Other  ?negative endocrine ROS ? Renal/GU ?negative Renal ROS  ?negative genitourinary ?  ?Musculoskeletal ? ? Abdominal ?  ?Peds ? Hematology ?negative hematology ROS ?(+)   ?Anesthesia Other Findings ? ? Reproductive/Obstetrics ?negative OB ROS ? ?  ? ? ? ? ? ? ? ? ? ? ? ? ? ?  ?  ? ? ? ? ? ? ? ?Anesthesia Physical ?Anesthesia Plan ? ?ASA: 2 ? ?Anesthesia Plan: General  ? ?Post-op Pain Management:   ? ?Induction:  ? ?PONV Risk Score and Plan:  ? ?Airway Management Planned:  ? ?Additional Equipment:  ? ?Intra-op Plan:  ? ?Post-operative Plan:  ? ?Informed Consent: I have reviewed the patients History and Physical, chart, labs and discussed the procedure including the risks, benefits and alternatives for the proposed anesthesia with the patient or authorized representative who has indicated his/her understanding and acceptance.  ? ? ? ?Dental Advisory Given ? ?Plan Discussed with: CRNA and Anesthesiologist ? ?Anesthesia Plan Comments:   ? ? ? ? ? ?Anesthesia Quick Evaluation ? ?

## 2021-09-24 NOTE — Op Note (Signed)
[facility] ?Gastroenterology ?Patient Name: Robert Henderson ?Procedure Date: 09/24/2021 11:02 AM ?MRN: [id number] ?Account #: [id number] ?Date of Birth: 25-Nov-1980 ?Admit Type: Outpatient ?Age: 41 ?Room: [facility] OR ROOM 01 ?Gender: Male ?Note Status: Finalized ?Instrument Name: [id number] ?Procedure:             Colonoscopy ?Indications:           Generalized abdominal pain ?Providers:             Lucilla Lame MD, MD ?Referring MD:          Lendon Collar (Referring MD) ?Medicines:             Propofol per Anesthesia ?Complications:         No immediate complications. ?Procedure:             Pre-Anesthesia Assessment: ?                       - Prior to the procedure, a History and Physical was  ?                       performed, and patient medications and allergies were  ?                       reviewed. The patient's tolerance of previous  ?                       anesthesia was also reviewed. The risks and benefits  ?                       of the procedure and the sedation options and risks  ?                       were discussed with the patient. All questions were  ?                       answered, and informed consent was obtained. Prior  ?                       Anticoagulants: The patient has taken no previous  ?                       anticoagulant or antiplatelet agents. ASA Grade  ?                       Assessment: II - A patient with mild systemic disease.  ?                       After reviewing the risks and benefits, the patient  ?                       was deemed in satisfactory condition to undergo the  ?                       procedure. ?                       After obtaining informed consent, the colonoscope was  ?  passed under direct vision. Throughout the procedure,  ?                       the patient's blood pressure, pulse, and oxygen  ?                       saturations were monitored continuously. The was  ?                       introduced through the anus  and advanced to the the  ?                       cecum, identified by appendiceal orifice and ileocecal  ?                       valve. The colonoscopy was performed without  ?                       difficulty. The patient tolerated the procedure well.  ?                       The quality of the bowel preparation was excellent. ?Findings: ?     The perianal and digital rectal examinations were normal. ?     A 4 mm polyp was found in the descending colon. The polyp was sessile.  ?     The polyp was removed with a cold snare. Resection and retrieval were  ?     complete. ?     The exam was otherwise without abnormality. ?Impression:            - One 4 mm polyp in the descending colon, removed with  ?                       a cold snare. Resected and retrieved. ?                       - The examination was otherwise normal. ?Recommendation:        - Discharge patient to home. ?                       - Resume previous diet. ?                       - Continue present medications. ?                       - Await pathology results. ?                       - If the pathology report reveals adenomatous tissue,  ?                       then repeat the colonoscopy for surveillance in 7  ?                       years. ?                       - No casue for the patient symptoms seen on the EGD or  ?  colonoscopy ?Procedure Code(s):     --- Professional --- ?                       [phone], Colonoscopy, flexible; with removal of  ?                       tumor(s), polyp(s), or other lesion(s) by snare  ?                       technique ?Diagnosis Code(s):     --- Professional --- ?                       R10.84, Generalized abdominal pain ?                       K63.5, Polyp of colon ?CPT copyright 2019 American Medical Association. All rights reserved. ?The codes documented in this report are preliminary and upon coder review may  ?be revised to meet current compliance requirements. ?Lucilla Lame MD, MD ?09/24/2021  11:26:17 AM ?This report has been signed electronically. ?Number of Addenda: 0 ?Note Initiated On: 09/24/2021 11:02 AM ?Scope Withdrawal Time: 0 hours 6 minutes 12 seconds  ?Total Procedure Duration: 0 hours 7 minutes 28 seconds  ?Estimated Blood Loss:  Estimated blood loss: none. ?     [facility] ?

## 2021-09-24 NOTE — Transfer of Care (Signed)
Immediate Anesthesia Transfer of Care Note ? ?Patient: Robert Henderson ? ?Procedure(s) Performed: COLONOSCOPY WITH PROPOFOL ?ESOPHAGOGASTRODUODENOSCOPY (EGD) WITH PROPOFOL ? ?Patient Location: PACU ? ?Anesthesia Type: General ? ?Level of Consciousness: awake, alert  and patient cooperative ? ?Airway and Oxygen Therapy: Patient Spontanous Breathing and Patient connected to supplemental oxygen ? ?Post-op Assessment: Post-op Vital signs reviewed, Patient's Cardiovascular Status Stable, Respiratory Function Stable, Patent Airway and No signs of Nausea or vomiting ? ?Post-op Vital Signs: Reviewed and stable ? ?Complications: No notable events documented. ? ?

## 2021-09-24 NOTE — Interval H&P Note (Signed)
? ?  Robert Lame, MD Robert Henderson ?[address] ?[place], [place] [address] ?Phone:[phone] ?Fax : [phone] ? ?Primary Care Physician:  Robert Collar, MD ?Primary Gastroenterologist:  Dr. Allen Henderson ? ?Pre-Procedure History & Physical: ?HPI:  Robert Henderson is a 41 y.o. male is here for an endoscopy and colonoscopy. ?  ?History reviewed. No pertinent past medical history. ? ?Past Surgical History:  ?Procedure Laterality Date  ? APPENDECTOMY    ? TOOTH EXTRACTION    ? ? ?Prior to Admission medications   ?Medication Sig Start Date End Date Taking? Authorizing Provider  ?acetaminophen (TYLENOL) 325 MG tablet Take 650 mg by mouth every 6 (six) hours as needed.   Yes [provider]  ?ibuprofen (ADVIL) 200 MG tablet Take 200 mg by mouth every 6 (six) hours as needed.   Yes [provider]  ? ? ?Allergies as of 09/24/2021 - Review Complete 09/24/2021  ?Allergen Reaction Noted  ? Amoxicillin-pot clavulanate Hives 06/29/2014  ? ? ?Family History  ?Problem Relation Age of Onset  ? Colon polyps Mother   ? Stomach cancer Father   ? ? ?Social History  ? ?Socioeconomic History  ? Marital status: Single  ?  Spouse name: Not on file  ? Number of children: Not on file  ? Years of education: Not on file  ? Highest education level: Not on file  ?Occupational History  ? Not on file  ?Tobacco Use  ? Smoking status: Every Day  ?  Packs/day: 1.00  ?  Types: Cigarettes  ? Smokeless tobacco: Never  ? Tobacco comments:  ?  Started smoking age 55  ?Vaping Use  ? Vaping Use: Never used  ?Substance and Sexual Activity  ? Alcohol use: Not Currently  ? Drug use: Not on file  ? Sexual activity: Not on file  ?Other Topics Concern  ? Not on file  ?Social History Narrative  ? Not on file  ? ?Social Determinants of Health  ? ?Financial Resource Strain: Not on file  ?Food Insecurity: Not on file  ?Transportation Needs: Not on file  ?Physical Activity: Not on file  ?Stress: Not on file  ?Social Connections: Not on file   ?Intimate Partner Violence: Not on file  ? ? ?Review of Systems: ?See HPI, otherwise negative ROS ? ?Physical Exam: ?BP 118/71   Pulse 70   Temp 97.9 ?F (36.6 ?C) (Temporal)   Resp 16   Ht 6' (1.829 m)   Wt 93.9 kg   SpO2 99%   BMI 28.07 kg/m?  ?General:   Alert,  pleasant and cooperative in NAD ?Head:  Normocephalic and atraumatic. ?Neck:  Supple; no masses or thyromegaly. ?Lungs:  Clear throughout to auscultation.    ?Heart:  Regular rate and rhythm. ?Abdomen:  Soft, nontender and nondistended. Normal bowel sounds, without guarding, and without rebound.   ?Neurologic:  Alert and  oriented x4;  grossly normal neurologically. ? ?Impression/Plan: ?Robert Henderson is here for an endoscopy and colonoscopy to be performed for generalized abdominal pain ? ?Risks, benefits, limitations, and alternatives regarding  endoscopy and colonoscopy have been reviewed with the patient.  Questions have been answered.  All parties agreeable. ? ? ?Robert Lame, MD  09/24/2021, 11:00 AM ?

## 2021-09-24 NOTE — Op Note (Signed)
[facility] ?Gastroenterology ?Patient Name: Robert Henderson ?Procedure Date: 09/24/2021 11:03 AM ?MRN: [id number] ?Account #: [id number] ?Date of Birth: 12-21-1980 ?Admit Type: Outpatient ?Age: 41 ?Room: [facility] OR ROOM 01 ?Gender: Male ?Note Status: Finalized ?Instrument Name: [id number] ?Procedure:             Upper GI endoscopy ?Indications:           Generalized abdominal pain ?Providers:             Lucilla Lame MD, MD ?Referring MD:          Lendon Collar (Referring MD) ?Medicines:             Propofol per Anesthesia ?Complications:         No immediate complications. ?Procedure:             Pre-Anesthesia Assessment: ?                       - Prior to the procedure, a History and Physical was  ?                       performed, and patient medications and allergies were  ?                       reviewed. The patient's tolerance of previous  ?                       anesthesia was also reviewed. The risks and benefits  ?                       of the procedure and the sedation options and risks  ?                       were discussed with the patient. All questions were  ?                       answered, and informed consent was obtained. Prior  ?                       Anticoagulants: The patient has taken no previous  ?                       anticoagulant or antiplatelet agents. ASA Grade  ?                       Assessment: II - A patient with mild systemic disease.  ?                       After reviewing the risks and benefits, the patient  ?                       was deemed in satisfactory condition to undergo the  ?                       procedure. ?                       After obtaining informed consent, the endoscope was  ?  passed under direct vision. Throughout the procedure,  ?                       the patient's blood pressure, pulse, and oxygen  ?                       saturations were monitored continuously. The Endoscope  ?                       was introduced  through the mouth, and advanced to the  ?                       second part of duodenum. The upper GI endoscopy was  ?                       accomplished without difficulty. The patient tolerated  ?                       the procedure well. ?Findings: ?     The examined esophagus was normal. ?     The entire examined stomach was normal. Biopsies were taken with a cold  ?     forceps for Helicobacter pylori testing. ?     A non-bleeding diverticulum was found in the second portion of the  ?     duodenum. ?Impression:            - Normal esophagus. ?                       - Normal stomach. Biopsied. ?                       - Non-bleeding duodenal diverticulum. ?Recommendation:        - Discharge patient to home. ?                       - Resume previous diet. ?                       - Continue present medications. ?                       - Perform a colonoscopy today. ?Procedure Code(s):     --- Professional --- ?                       [phone], Esophagogastroduodenoscopy, flexible,  ?                       transoral; with biopsy, single or multiple ?Diagnosis Code(s):     --- Professional --- ?                       R10.84, Generalized abdominal pain ?CPT copyright 2019 [employer]. All rights reserved. ?The codes documented in this report are preliminary and upon coder review may  ?be revised to meet current compliance requirements. ?Lucilla Lame MD, MD ?09/24/2021 11:14:36 AM ?This report has been signed electronically. ?Number of Addenda: 0 ?Note Initiated On: 09/24/2021 11:03 AM ?Total Procedure Duration: 0 hours 2 minutes 27 seconds  ?Estimated Blood Loss:  Estimated blood loss: none. ?     [facility] ?

## 2021-09-24 NOTE — Anesthesia Postprocedure Evaluation (Deleted)
Anesthesia Post Note ? ?Patient: Robert Henderson ? ?Procedure(s) Performed: COLONOSCOPY WITH PROPOFOL ?ESOPHAGOGASTRODUODENOSCOPY (EGD) WITH PROPOFOL ? ? ?  ?Patient location during evaluation: PACU ?Anesthesia Type: General ?Level of consciousness: awake and alert ?Pain management: pain level controlled ?Vital Signs Assessment: post-procedure vital signs reviewed and stable ?Respiratory status: spontaneous breathing, nonlabored ventilation, respiratory function stable and patient connected to nasal cannula oxygen ?Cardiovascular status: blood pressure returned to baseline and stable ?Postop Assessment: no apparent nausea or vomiting ?Anesthetic complications: no ? ? ?No notable events documented. ? ?Trecia Rogers ? ? ? ? ? ?

## 2021-09-25 ENCOUNTER — Encounter: Payer: Self-pay | Admitting: Gastroenterology

## 2021-09-27 ENCOUNTER — Ambulatory Visit: Payer: Non-veteran care | Admitting: Gastroenterology

## 2021-09-27 LAB — SURGICAL PATHOLOGY

## 2021-10-01 ENCOUNTER — Encounter: Payer: Self-pay | Admitting: Gastroenterology

## 2021-10-01 ENCOUNTER — Ambulatory Visit: Payer: Self-pay | Admitting: Gastroenterology

## 2021-10-08 ENCOUNTER — Encounter: Payer: Self-pay | Admitting: Surgery

## 2021-10-08 ENCOUNTER — Ambulatory Visit (INDEPENDENT_AMBULATORY_CARE_PROVIDER_SITE_OTHER): Payer: No Typology Code available for payment source | Admitting: Surgery

## 2021-10-08 VITALS — BP 119/80 | HR 93 | Temp 98.3°F | Ht 72.0 in | Wt 207.0 lb

## 2021-10-08 DIAGNOSIS — R1011 Right upper quadrant pain: Secondary | ICD-10-CM

## 2021-10-08 DIAGNOSIS — G8929 Other chronic pain: Secondary | ICD-10-CM

## 2021-10-08 DIAGNOSIS — R0789 Other chest pain: Secondary | ICD-10-CM | POA: Diagnosis not present

## 2021-10-08 NOTE — Patient Instructions (Signed)
   Follow-up with our office as needed.  Please call and ask to speak with a nurse if you develop questions or concerns.  

## 2021-10-09 ENCOUNTER — Telehealth: Payer: Self-pay | Admitting: Gastroenterology

## 2021-10-09 NOTE — Telephone Encounter (Signed)
Faxed medical records to [facility] On [phone]

## 2021-10-10 NOTE — Progress Notes (Signed)
Outpatient Surgical Follow Up  10/10/2021  Robert Henderson is an 41 y.o. male.   Chief Complaint  Patient presents with   Follow-up    HPI: Robert Henderson is following for chronic right upper quadrant and chest wall pain.  He completed an upper and lower endoscopies without any major pathological findings.  We do not have a good explanation for his abdominal pain.  I was extremely honest with him regarding the situation.  I do not think that surgical intervention would be in his best interest.  I do not believe that a cholecystectomy is indicated given the uncertainty of his diagnosis. He continues to have right upper quadrant pain.  No past medical history on file.  Past Surgical History:  Procedure Laterality Date   APPENDECTOMY     COLONOSCOPY WITH PROPOFOL N/A 09/24/2021   Procedure: COLONOSCOPY WITH PROPOFOL;  Surgeon: Lucilla Lame, MD;  Location: [place];  Service: Endoscopy;  Laterality: N/A;   ESOPHAGOGASTRODUODENOSCOPY (EGD) WITH PROPOFOL N/A 09/24/2021   Procedure: ESOPHAGOGASTRODUODENOSCOPY (EGD) WITH PROPOFOL;  Surgeon: Lucilla Lame, MD;  Location: [place];  Service: Endoscopy;  Laterality: N/A;   TOOTH EXTRACTION      Family History  Problem Relation Age of Onset   Colon polyps Mother    Stomach cancer Father     Social History:  reports that he has been smoking cigarettes. He has been smoking an average of 1 pack per day. He has been exposed to tobacco smoke. He has never used smokeless tobacco. He reports that he does not currently use alcohol. No history on file for drug use.  Allergies:  Allergies  Allergen Reactions   Amoxicillin-Pot Clavulanate Hives    Medications reviewed.    ROS Full ROS performed and is otherwise negative other than what is stated in HPI   BP 119/80   Pulse 93   Temp 98.3 F (36.8 C)   Ht 6' (1.829 m)   Wt 207 lb (93.9 kg)   SpO2 98%   BMI 28.07 kg/m   Physical Exam Physical Exam CONSTITUTIONAL:  NAD. EYES: Pupils are equal, round,, Sclera are non-icteric. EARS, NOSE, MOUTH AND THROAT:  The oral mucosa is pink and moist. Poor dentition with tooth decay .Hearing is intact to voice. LYMPH NODES:  Lymph nodes in the neck are normal. RESPIRATORY:  Lungs are clear. There is normal respiratory effort, with equal breath sounds bilaterally, and without pathologic use of accessory muscles.Tenderness to palpation on right chest wall and xiphoid CARDIOVASCULAR: Heart is regular without murmurs, gallops, or rubs. GI: The abdomen is  soft, mild tenderness diffusely but more pronounced RUQ  There are no palpable masses. There is no hepatosplenomegaly. There are normal bowel sounds . No rebound, no peritonitis and no definitive Murphy GU: Rectal deferred.   MUSCULOSKELETAL: Normal muscle strength and tone. No cyanosis or edema.   SKIN: Turgor is good and there are no pathologic skin lesions or ulcers. NEUROLOGIC: Motor and sensation is grossly normal. Cranial nerves are grossly intact. PSYCH:  Oriented to person, place and time. Affect is normal.   Assessment/Plan: Right upper quadrant pain and chest wall pain of unclear etiology.  No evidence of cholecystitis and no evidence of an acute mechanical intra-abdominal process that would merit surgical intervention. Had an extensive discussion with him regarding our limitations.  I will reserve diagnostic laparoscopy and cholecystectomy as the truly last resort. He will go back to primary care and explore other differential diagnosis. I spent 30 minutes in  this encounter including personally reviewing imaging studies, placing orders, counseling the patient and performing appropriate documentation  Caroleen Hamman, MD [place] Surgeon

## 2022-03-11 ENCOUNTER — Telehealth: Payer: Self-pay | Admitting: Gastroenterology

## 2022-03-11 NOTE — Telephone Encounter (Signed)
Pts medical records from 09/03/2021 to 09/24/2021 Were sent to Department of [facility]  [place] [id number] ON 03/11/2022
